# Patient Record
Sex: Female | Born: 1995 | Race: White | Hispanic: No | Marital: Single | Smoking: Never smoker
Health system: Southern US, Community
[De-identification: ages and names within clinical notes are randomized; demographics above are authoritative.]

## PROBLEM LIST (undated history)

## (undated) DIAGNOSIS — E039 Hypothyroidism, unspecified: Secondary | ICD-10-CM

## (undated) HISTORY — PX: HEMORROIDECTOMY: SUR656

## (undated) HISTORY — DX: Hypothyroidism, unspecified: E03.9

---

## 2016-01-03 LAB — TSH: TSH: 0 u[IU]/mL — AB (ref <=5.90)

## 2016-03-05 LAB — TSH: TSH: 6.08 u[IU]/mL — AB (ref <=5.90)

## 2016-04-09 ENCOUNTER — Ambulatory Visit (INDEPENDENT_AMBULATORY_CARE_PROVIDER_SITE_OTHER): Payer: BLUE CROSS/BLUE SHIELD | Admitting: "Endocrinology

## 2016-04-09 ENCOUNTER — Encounter: Payer: Self-pay | Admitting: "Endocrinology

## 2016-04-09 VITALS — BP 107/71 | HR 81 | Ht 64.0 in | Wt 191.0 lb

## 2016-04-09 DIAGNOSIS — E039 Hypothyroidism, unspecified: Secondary | ICD-10-CM | POA: Diagnosis not present

## 2016-04-09 NOTE — Progress Notes (Signed)
Subjective:    Patient ID: Emily Deleon, female    DOB: [birth year], PCP Valla Leaver, MD   Past Medical History:  Diagnosis Date  . Hypothyroidism    Past Surgical History:  Procedure Laterality Date  . HEMORROIDECTOMY     Social History   Social History  . Marital status: Single    Spouse name: N/A  . Number of children: N/A  . Years of education: N/A   Social History Main Topics  . Smoking status: Never Smoker  . Smokeless tobacco: Never Used  . Alcohol use No  . Drug use: No  . Sexual activity: Not Asked   Other Topics Concern  . None   Social History Narrative  . None   Outpatient Encounter Prescriptions as of 04/09/2016  Medication Sig  . levothyroxine (SYNTHROID, LEVOTHROID) 25 MCG tablet Take 12.5 mcg by mouth daily.   No facility-administered encounter medications on file as of 04/09/2016.    ALLERGIES: Allergies  Allergen Reactions  . Penicillins     VACCINATION STATUS:  There is no immunization history on file for this patient.  HPI  21 year old female patient with medical history as above. She is being seen in consultation for hypothyroidism requested by her PCP  Valla Leaver, MD. - She has a long-standing history of thyroid dysfunction. She reports that at age 38 she was found to have hyperthyroidism however not offered any kind of treatment. She did have fluctuating thyroid function test over the years until last August 2017 when she was started on levothyroxine 25 mg by mouth every morning. - On January 03, 2016 her TSH was undetectable along with free T4 of 2.94 which prompted the discontinuation of her levothyroxine. -On subsequent visits with her PMD on 03/05/2016 she was found to have TSH of 6.08, free T4 0.73, and free T3 3.0. She was restarted on levothyroxine 12.5 g and she has taken it ever since. -She reports compliance to this medication. - She denies any exposure to radioactive iodine therapy, thyroid  surgery, exposure to any over-the-counter thyroid supplements. -She has family history of hypothyroidism in her mother who is taking levothyroxine. -He does not have to clearly acute symptoms today, however she reports progressive weight gain. -She denies cold, heat intolerance. She denies dysphagia, shortness of breath, nor voice change.  Review of Systems  Constitutional: Progressive weight gain, no fatigue, no subjective hyperthermia, no subjective hypothermia Eyes: no blurry vision, no xerophthalmia ENT: no sore throat, no nodules palpated in throat, no dysphagia/odynophagia, no hoarseness Cardiovascular: no Chest Pain, no Shortness of Breath, no palpitations, no leg swelling Respiratory: no cough, no SOB Gastrointestinal: no Nausea/Vomiting/Diarhhea Musculoskeletal: no muscle/joint aches Skin: no rashes Neurological: no tremors, no numbness, no tingling, no dizziness Psychiatric: no depression, no anxiety  Objective:    BP 107/71   Pulse 81   Ht 5\' 4"  (1.626 m)   Wt 191 lb (86.6 kg)   BMI 32.79 kg/m   Wt Readings from Last 3 Encounters:  04/09/16 191 lb (86.6 kg)    Physical Exam  Constitutional: Significantly over weight for hight, not in acute distress, normal state of mind Eyes: PERRLA, EOMI, no exophthalmos ENT: moist mucous membranes, no thyromegaly, no cervical lymphadenopathy Cardiovascular: normal precordial activity, Regular Rate and Rhythm, no Murmur/Rubs/Gallops Respiratory:  adequate breathing efforts, no gross chest deformity, Clear to auscultation bilaterally Gastrointestinal: abdomen soft, Non -tender, No distension, Bowel Sounds present Musculoskeletal: no gross deformities, strength intact in all four extremities Skin: moist, warm, no rashes Neurological:  no tremor with outstretched hands, Deep tendon reflexes normal in all four extremities.  Recent Results (from the past 2160 hour(s))  TSH     Status: Abnormal   Collection Time: 03/05/16 12:00 AM   Result Value Ref Range   TSH 6.08 (A) 0.41 - 5.90 uIU/mL    Comment: Free T4 0.73, free T3 3.0       Assessment & Plan:   1. Hypothyroidism, unspecified type - This patient is being seen in kind request of  EGGLESTON-CLARK,VALENICA, MD. - I have reviewed her available records and clinically evaluated patient. She does not seem to be specifically symptomatic at this time. I kept her on the same small dose of levothyroxine 12.5 g by mouth every morning.    - We discussed about correct intake of levothyroxine, at fasting, with water, separated by at least 30 minutes from breakfast, and separated by more than 4 hours from calcium, iron, multivitamins, acid reflux medications (PPIs). -Patient is made aware of the fact that thyroid hormone replacement is needed for life, dose to be adjusted by periodic monitoring of thyroid function tests.   However I'm a I will send her to lab to obtain full profile thyroid function test including TSH, free T4, TPO antibodies, and thyroglobulin antibodies. -Based on these results, her levothyroxine dose would be adjusted next visit in 1 week.  - Based on her current body weight, she would require higher dose of levothyroxine, if her next labs are in agreement for hypothyroidism. -I have advised her to stay away from over-the-counter thyroid supplements. -She does not have clinical goiter hence no need for immediate imaging of the thyroid.  - I advised patient to maintain close follow up with Valla LeaverEGGLESTON-CLARK,VALENICA, MD for primary care needs. Follow up plan: Return in about 1 week (around 04/16/2016) for labs today.  Marquis LunchGebre Cordelia Bessinger, MD Phone: [phone]  Fax: [phone]   04/09/2016, 11:22 AM

## 2016-04-10 LAB — THYROGLOBULIN ANTIBODY: THYROGLOBULIN AB: 188 [IU]/mL — AB (ref <2)

## 2016-04-10 LAB — T4, FREE: Free T4: 1.1 ng/dL (ref 0.8–1.8)

## 2016-04-10 LAB — THYROID PEROXIDASE ANTIBODY: Thyroperoxidase Ab SerPl-aCnc: 2 IU/mL (ref <9)

## 2016-04-10 LAB — TSH: TSH: 4.29 m[IU]/L

## 2016-04-16 ENCOUNTER — Ambulatory Visit: Payer: BLUE CROSS/BLUE SHIELD | Admitting: "Endocrinology

## 2016-04-24 ENCOUNTER — Encounter: Payer: Self-pay | Admitting: "Endocrinology

## 2016-04-24 ENCOUNTER — Ambulatory Visit (INDEPENDENT_AMBULATORY_CARE_PROVIDER_SITE_OTHER): Payer: BLUE CROSS/BLUE SHIELD | Admitting: "Endocrinology

## 2016-04-24 VITALS — BP 106/73 | HR 74 | Ht 64.0 in | Wt 195.0 lb

## 2016-04-24 DIAGNOSIS — E063 Autoimmune thyroiditis: Secondary | ICD-10-CM

## 2016-04-24 DIAGNOSIS — E038 Other specified hypothyroidism: Secondary | ICD-10-CM | POA: Diagnosis not present

## 2016-04-24 MED ORDER — LEVOTHYROXINE SODIUM 25 MCG PO TABS: 25.0000 ug | ORAL_TABLET | Freq: Every day | ORAL | 2 refills | Status: DC

## 2016-04-24 NOTE — Progress Notes (Signed)
Subjective:    Patient ID: Emily Deleon, female    DOB: September 05, 1995, PCP Valla Leaver, MD   Past Medical History:  Diagnosis Date  . Hypothyroidism    Past Surgical History:  Procedure Laterality Date  . HEMORROIDECTOMY     Social History   Social History  . Marital status: Single    Spouse name: N/A  . Number of children: N/A  . Years of education: N/A   Social History Main Topics  . Smoking status: Never Smoker  . Smokeless tobacco: Never Used  . Alcohol use No  . Drug use: No  . Sexual activity: Not Asked   Other Topics Concern  . None   Social History Narrative  . None   Outpatient Encounter Prescriptions as of 04/24/2016  Medication Sig  . levothyroxine (SYNTHROID, LEVOTHROID) 25 MCG tablet Take 1 tablet (25 mcg total) by mouth daily before breakfast.  . [DISCONTINUED] levothyroxine (SYNTHROID, LEVOTHROID) 25 MCG tablet Take 12.5 mcg by mouth daily.   No facility-administered encounter medications on file as of 04/24/2016.    ALLERGIES: Allergies  Allergen Reactions  . Penicillins     VACCINATION STATUS:  There is no immunization history on file for this patient.  HPI  21 year old female patient with medical history as above. She is being seen in f/u for hypothyroidism.  - She remains on levothyroxine 12.5 g daily.-She reports compliance to this medication. - She has no new complaints, however, she has progressive weight gain.  - She denies any exposure to radioactive iodine therapy, thyroid surgery, exposure to any over-the-counter thyroid supplements. -She has family history of hypothyroidism in her mother who is taking levothyroxine. -She denies cold, heat intolerance. She denies dysphagia, shortness of breath, nor voice change.  Review of Systems  Constitutional: Progressive weight gain, no fatigue, no subjective hyperthermia, no subjective hypothermia Eyes: no blurry vision, no xerophthalmia ENT: no sore throat, no nodules  palpated in throat, no dysphagia/odynophagia, no hoarseness Cardiovascular: no Chest Pain, no Shortness of Breath, no palpitations, no leg swelling Respiratory: no cough, no SOB Gastrointestinal: no Nausea/Vomiting/Diarhhea Musculoskeletal: no muscle/joint aches Skin: no rashes Neurological: no tremors, no numbness, no tingling, no dizziness Psychiatric: no depression, no anxiety  Objective:    BP 106/73   Pulse 74   Ht  (1.626 m)   Wt 195 lb (88.5 kg)   BMI 33.47 kg/m   Wt Readings from Last 3 Encounters:  04/24/16 195 lb (88.5 kg)  04/09/16 191 lb (86.6 kg)    Physical Exam  Constitutional: Significantly over weight for hight, not in acute distress, normal state of mind Eyes: PERRLA, EOMI, no exophthalmos ENT: moist mucous membranes, no thyromegaly, no cervical lymphadenopathy Cardiovascular: normal precordial activity, Regular Rate and Rhythm, no Murmur/Rubs/Gallops Respiratory:  adequate breathing efforts, no gross chest deformity, Clear to auscultation bilaterally Gastrointestinal: abdomen soft, Non -tender, No distension, Bowel Sounds present Musculoskeletal: no gross deformities, strength intact in all four extremities Skin: moist, warm, no rashes Neurological: no tremor with outstretched hands, Deep tendon reflexes normal in all four extremities.  Recent Results (from the past 2160 hour(s))  TSH     Status: Abnormal   Collection Time: 03/05/16 12:00 AM  Result Value Ref Range   TSH 6.08 (A) 0.41 - 5.90 uIU/mL    Comment: Free T4 0.73, free T3 3.0  Thyroglobulin antibody     Status: Abnormal   Collection Time: 04/09/16 10:48 AM  Result Value Ref Range   Thyroglobulin Ab 188 (H) <2  IU/mL  Thyroid peroxidase antibody     Status: None   Collection Time: 04/09/16 10:48 AM  Result Value Ref Range   Thyroperoxidase Ab SerPl-aCnc 2 <9 IU/mL  T4, free     Status: None   Collection Time: 04/09/16 10:48 AM  Result Value Ref Range   Free T4 1.1 0.8 - 1.8 ng/dL  TSH      Status: None   Collection Time: 04/09/16 10:48 AM  Result Value Ref Range   TSH 4.29 mIU/L    Comment:   Reference Range   > or = 20 Years  0.40-4.50   Pregnancy Range First trimester  0.26-2.66 Second trimester 0.55-2.73 Third trimester  0.43-2.91          Assessment & Plan:   1. Hypothyroidism, 2. Hashimoto's thyroiditis  She has hypothyroidism due to Hashimoto's thyroiditis. She will benefit from increase in her levothyroxine. I advised her to increase her levothyroxine to 25 g by mouth every morning. She may require higher dose on subsequent visits depending on her labs.  - We discussed about correct intake of levothyroxine, at fasting, with water, separated by at least 30 minutes from breakfast, and separated by more than 4 hours from calcium, iron, multivitamins, acid reflux medications (PPIs). -Patient is made aware of the fact that thyroid hormone replacement is needed for life, dose to be adjusted by periodic monitoring of thyroid function tests.  - She is advised to contact us if she plans or confirms pregnancy since she will need higher dose of levothyroxine during early pregnancy. -She does not have clinical goiter hence no need for immediate imaging of the thyroid.  - I advised patient to maintain close follow up with Valla Leaver, MD for primary care needs. Follow up plan: Return in about 3 months (around 07/24/2016) for follow up with pre-visit labs.  Marquis Lunch, MD Phone: [phone]  Fax: [phone]   04/24/2016, 9:33 AM

## 2016-08-09 ENCOUNTER — Other Ambulatory Visit: Payer: Self-pay | Admitting: "Endocrinology

## 2016-08-09 ENCOUNTER — Ambulatory Visit: Payer: BLUE CROSS/BLUE SHIELD | Admitting: "Endocrinology

## 2016-08-09 LAB — TSH: TSH: 3.73 mIU/L

## 2016-08-09 LAB — T4, FREE: Free T4: 1.3 ng/dL (ref 0.8–1.8)

## 2016-08-20 ENCOUNTER — Encounter: Payer: Self-pay | Admitting: "Endocrinology

## 2016-08-20 ENCOUNTER — Ambulatory Visit (INDEPENDENT_AMBULATORY_CARE_PROVIDER_SITE_OTHER): Payer: BLUE CROSS/BLUE SHIELD | Admitting: "Endocrinology

## 2016-08-20 VITALS — BP 123/82 | HR 65 | Ht 64.0 in | Wt 198.0 lb

## 2016-08-20 DIAGNOSIS — E038 Other specified hypothyroidism: Secondary | ICD-10-CM

## 2016-08-20 DIAGNOSIS — E063 Autoimmune thyroiditis: Secondary | ICD-10-CM

## 2016-08-20 MED ORDER — LEVOTHYROXINE SODIUM 50 MCG PO TABS: 50.0000 ug | ORAL_TABLET | Freq: Every day | ORAL | 6 refills | Status: DC

## 2016-08-20 NOTE — Progress Notes (Signed)
Subjective:    Patient ID: Emily Deleon, female    DOB: 1995/05/22, PCP Valla LeaverEggleston-Clark, Valenica, MD   Past Medical History:  Diagnosis Date  . Hypothyroidism    Past Surgical History:  Procedure Laterality Date  . HEMORROIDECTOMY     Social History   Social History  . Marital status: Single    Spouse name: N/A  . Number of children: N/A  . Years of education: N/A   Social History Main Topics  . Smoking status: Never Smoker  . Smokeless tobacco: Never Used  . Alcohol use No  . Drug use: No  . Sexual activity: Not Asked   Other Topics Concern  . None   Social History Narrative  . None   Outpatient Encounter Prescriptions as of 08/20/2016  Medication Sig  . levothyroxine (SYNTHROID, LEVOTHROID) 50 MCG tablet Take 1 tablet (50 mcg total) by mouth daily before breakfast.  . [DISCONTINUED] levothyroxine (SYNTHROID, LEVOTHROID) 25 MCG tablet Take 1 tablet (25 mcg total) by mouth daily before breakfast.   No facility-administered encounter medications on file as of 08/20/2016.    ALLERGIES: Allergies  Allergen Reactions  . Penicillins     VACCINATION STATUS:  There is no immunization history on file for this patient.  HPI  21 year old female patient with medical history as above. She is being seen in f/u for hypothyroidism.  - She remains on levothyroxine 25 g daily.-She reports compliance to this medication. - She has no new complaints, however, she has progressive weight gain.  - She denies any exposure to radioactive iodine therapy, thyroid surgery, exposure to any over-the-counter thyroid supplements. -She has family history of hypothyroidism in her mother who is taking levothyroxine. -She denies cold, heat intolerance. She denies dysphagia, shortness of breath, nor voice change.  Review of Systems  Constitutional: + Progressive weight gain, no fatigue, no subjective hyperthermia, no subjective hypothermia Eyes: no blurry vision, no  xerophthalmia ENT: no sore throat, no nodules palpated in throat, no dysphagia/odynophagia, no hoarseness Cardiovascular: no Chest Pain, no Shortness of Breath, no palpitations, no leg swelling Respiratory: no cough, no SOB Gastrointestinal: no Nausea/Vomiting/Diarhhea Musculoskeletal: no muscle/joint aches Skin: no rashes Neurological: no tremors, no numbness, no tingling, no dizziness Psychiatric: no depression, no anxiety  Objective:    BP 123/82   Pulse 65   Ht 5\' 4"  (1.626 m)   Wt 198 lb (89.8 kg)   BMI 33.99 kg/m   Wt Readings from Last 3 Encounters:  08/20/16 198 lb (89.8 kg)  04/24/16 195 lb (88.5 kg)  04/09/16 191 lb (86.6 kg)    Physical Exam  Constitutional: Significantly over weight for hight, not in acute distress, normal state of mind Eyes: PERRLA, EOMI, no exophthalmos ENT: moist mucous membranes, no thyromegaly, no cervical lymphadenopathy Cardiovascular: normal precordial activity, Regular Rate and Rhythm, no Murmur/Rubs/Gallops Respiratory:  adequate breathing efforts, no gross chest deformity, Clear to auscultation bilaterally Gastrointestinal: abdomen soft, Non -tender, No distension, Bowel Sounds present Musculoskeletal: no gross deformities, strength intact in all four extremities Skin: moist, warm, no rashes Neurological: no tremor with outstretched hands, Deep tendon reflexes normal in all four extremities.  Recent Results (from the past 2160 hour(s))  TSH     Status: None   Collection Time: 08/09/16  9:03 AM  Result Value Ref Range   TSH 3.73 mIU/L    Comment:   Reference Range   > or = 20 Years  0.40-4.50   Pregnancy Range First trimester  0.26-2.66 Second trimester 0.55-2.73  Third trimester  0.43-2.91     T4, free     Status: None   Collection Time: 08/09/16  9:03 AM  Result Value Ref Range   Free T4 1.3 0.8 - 1.8 ng/dL       Assessment & Plan:   1. Hypothyroidism, 2. Hashimoto's thyroiditis  She has hypothyroidism due to  Hashimoto's thyroiditis. She will benefit from increase in her levothyroxine. I advised her to increase her levothyroxine to 50 g by mouth every morning. She may require higher dose on subsequent visits depending on her labs.  - We discussed about correct intake of levothyroxine, at fasting, with water, separated by at least 30 minutes from breakfast, and separated by more than 4 hours from calcium, iron, multivitamins, acid reflux medications (PPIs). -Patient is made aware of the fact that thyroid hormone replacement is needed for life, dose to be adjusted by periodic monitoring of thyroid function tests.  - She is advised to contact us if she plans or confirms pregnancy since she will need higher dose of levothyroxine during early pregnancy. -She does not have clinical goiter hence no need for immediate imaging of the thyroid.  - I advised patient to maintain close follow up with Valla LeaverEggleston-Clark, Valenica, MD for primary care needs. Follow up plan: Return in about 3 months (around 11/20/2016) for follow up with pre-visit labs.  Marquis LunchGebre Wavie Hashimi, MD Phone: [phone]  Fax: [phone]   08/20/2016, 9:49 AM

## 2016-11-26 ENCOUNTER — Ambulatory Visit: Payer: BLUE CROSS/BLUE SHIELD | Admitting: "Endocrinology

## 2016-12-17 LAB — T4, FREE: FREE T4: 1.1 ng/dL (ref 0.8–1.8)

## 2016-12-17 LAB — TSH: TSH: 1.56 mIU/L

## 2016-12-18 ENCOUNTER — Encounter: Payer: Self-pay | Admitting: "Endocrinology

## 2016-12-18 ENCOUNTER — Ambulatory Visit (INDEPENDENT_AMBULATORY_CARE_PROVIDER_SITE_OTHER): Payer: BLUE CROSS/BLUE SHIELD | Admitting: "Endocrinology

## 2016-12-18 VITALS — BP 127/77 | HR 69 | Ht 69.0 in | Wt 213.0 lb

## 2016-12-18 DIAGNOSIS — E038 Other specified hypothyroidism: Secondary | ICD-10-CM | POA: Diagnosis not present

## 2016-12-18 DIAGNOSIS — E063 Autoimmune thyroiditis: Secondary | ICD-10-CM | POA: Diagnosis not present

## 2016-12-18 MED ORDER — LEVOTHYROXINE SODIUM 75 MCG PO TABS: 75.0000 ug | ORAL_TABLET | Freq: Every day | ORAL | 6 refills | Status: DC

## 2016-12-18 NOTE — Progress Notes (Signed)
Subjective:    Patient ID: Emily Deleon, female    DOB: 1995-10-10, PCP Valla LeaverEggleston-Clark, Valenica, MD   Past Medical History:  Diagnosis Date  . Hypothyroidism    Past Surgical History:  Procedure Laterality Date  . HEMORROIDECTOMY     Social History   Socioeconomic History  . Marital status: Single    Spouse name: None  . Number of children: None  . Years of education: None  . Highest education level: None  Social Needs  . Financial resource strain: None  . Food insecurity - worry: None  . Food insecurity - inability: None  . Transportation needs - medical: None  . Transportation needs - non-medical: None  Occupational History  . None  Tobacco Use  . Smoking status: Never Smoker  . Smokeless tobacco: Never Used  Substance and Sexual Activity  . Alcohol use: No  . Drug use: No  . Sexual activity: None  Other Topics Concern  . None  Social History Narrative  . None   Outpatient Encounter Medications as of 12/18/2016  Medication Sig  . levothyroxine (SYNTHROID, LEVOTHROID) 75 MCG tablet Take 1 tablet (75 mcg total) by mouth daily before breakfast.  . [DISCONTINUED] levothyroxine (SYNTHROID, LEVOTHROID) 50 MCG tablet Take 1 tablet (50 mcg total) by mouth daily before breakfast.   No facility-administered encounter medications on file as of 12/18/2016.    ALLERGIES: Allergies  Allergen Reactions  . Penicillins     VACCINATION STATUS:  There is no immunization history on file for this patient.  HPI  21 year old female patient with medical history as above. She is being seen in f/u for hypothyroidism.  - She remains on levothyroxine 50 g daily.-She reports compliance to this medication. - She has no new complaints, however, she has progressive weight gain.  - She denies any exposure to radioactive iodine therapy, thyroid surgery, exposure to any over-the-counter thyroid supplements. -She has family history of hypothyroidism in her mother who is taking  levothyroxine. -She denies cold, heat intolerance. She denies dysphagia, shortness of breath, nor voice change.  Review of Systems  Constitutional: + Progressive weight gain, no fatigue, no subjective hyperthermia, no subjective hypothermia Eyes: no blurry vision, no xerophthalmia ENT: no sore throat, no nodules palpated in throat, no dysphagia/odynophagia, no hoarseness Cardiovascular: no Chest Pain, no Shortness of Breath, no palpitations, no leg swelling Respiratory: no cough, no SOB Gastrointestinal: no Nausea/Vomiting/Diarhhea Musculoskeletal: no muscle/joint aches Skin: no rashes Neurological: no tremors, no numbness, no tingling, no dizziness Psychiatric: no depression, no anxiety  Objective:    BP 127/77   Pulse 69   Ht 5\' 9"  (1.753 m)   Wt 213 lb (96.6 kg)   BMI 31.45 kg/m   Wt Readings from Last 3 Encounters:  12/18/16 213 lb (96.6 kg)  08/20/16 198 lb (89.8 kg)  04/24/16 195 lb (88.5 kg)    Physical Exam  Constitutional: Significantly over weight for height, not in acute distress, normal state of mind Eyes: PERRLA, EOMI, no exophthalmos ENT: moist mucous membranes, no thyromegaly, no cervical lymphadenopathy Cardiovascular: normal precordial activity, Regular Rate and Rhythm, no Murmur/Rubs/Gallops Respiratory:  adequate breathing efforts, no gross chest deformity, Clear to auscultation bilaterally Gastrointestinal: abdomen soft, Non -tender, No distension, Bowel Sounds present Musculoskeletal: no gross deformities, strength intact in all four extremities Skin: moist, warm, no rashes Neurological: no tremor with outstretched hands, Deep tendon reflexes normal in all four extremities.  Recent Results (from the past 2160 hour(s))  T4, free  Status: None   Collection Time: 12/16/16  9:38 AM  Result Value Ref Range   Free T4 1.1 0.8 - 1.8 ng/dL  TSH     Status: None   Collection Time: 12/16/16  9:38 AM  Result Value Ref Range   TSH 1.56 mIU/L    Comment:            Reference Range .           > or = 20 Years  0.40-4.50 .                Pregnancy Ranges           First trimester    0.26-2.66           Second trimester   0.55-2.73           Third trimester    0.43-2.91        Assessment & Plan:   1. Hypothyroidism, 2. Hashimoto's thyroiditis  She has hypothyroidism due to Hashimoto's thyroiditis. She will benefit from increase in her levothyroxine. I advised her to increase her levothyroxine to 75 g by mouth every morning. She may require higher dose on subsequent visits depending on her labs.  - We discussed about correct intake of levothyroxine, at fasting, with water, separated by at least 30 minutes from breakfast, and separated by more than 4 hours from calcium, iron, multivitamins, acid reflux medications (PPIs). -Patient is made aware of the fact that thyroid hormone replacement is needed for life, dose to be adjusted by periodic monitoring of thyroid function tests.  - She is advised to contact us if she plans or confirms pregnancy since she will need higher dose of levothyroxine during early pregnancy. -She does not have clinical goiter hence no need for immediate imaging of the thyroid. - Given her concern of progressive weight gain, although unlikely, I will include 24-hour urine free cortisol along with her next lab work.  - I advised patient to maintain close follow up with Valla LeaverEggleston-Clark, Valenica, MD for primary care needs. Follow up plan: Return in about 6 months (around 06/17/2017) for follow up with pre-visit labs, 24 hour urine free Cortisol.  Marquis LunchGebre Ferdinand Revoir, MD Phone: [phone]  Fax: [phone]  -  This note was partially dictated with voice recognition software. Similar sounding words can be transcribed inadequately or may not  be corrected upon review.  12/18/2016, 11:33 AM

## 2017-04-30 ENCOUNTER — Other Ambulatory Visit: Payer: Self-pay | Admitting: "Endocrinology

## 2017-06-12 LAB — TSH: TSH: 1.22 m[IU]/L

## 2017-06-12 LAB — T4, FREE: FREE T4: 1.2 ng/dL (ref 0.8–1.8)

## 2017-06-19 ENCOUNTER — Encounter: Payer: Self-pay | Admitting: "Endocrinology

## 2017-06-19 ENCOUNTER — Ambulatory Visit (INDEPENDENT_AMBULATORY_CARE_PROVIDER_SITE_OTHER): Payer: BLUE CROSS/BLUE SHIELD | Admitting: "Endocrinology

## 2017-06-19 VITALS — BP 111/75 | HR 76 | Ht 69.0 in | Wt 207.0 lb

## 2017-06-19 DIAGNOSIS — E038 Other specified hypothyroidism: Secondary | ICD-10-CM

## 2017-06-19 DIAGNOSIS — E063 Autoimmune thyroiditis: Secondary | ICD-10-CM | POA: Diagnosis not present

## 2017-06-19 DIAGNOSIS — E049 Nontoxic goiter, unspecified: Secondary | ICD-10-CM

## 2017-06-19 MED ORDER — LEVOTHYROXINE SODIUM 75 MCG PO TABS: ORAL_TABLET | ORAL | 6 refills | Status: DC

## 2017-06-19 NOTE — Progress Notes (Signed)
Subjective:    Patient ID: Emily Deleon, female    DOB: November 19, 1995, PCP Valla Leaver, MD   Past Medical History:  Diagnosis Date  . Hypothyroidism    Past Surgical History:  Procedure Laterality Date  . HEMORROIDECTOMY     Social History   Socioeconomic History  . Marital status: Single    Spouse name: Not on file  . Number of children: Not on file  . Years of education: Not on file  . Highest education level: Not on file  Occupational History  . Not on file  Social Needs  . Financial resource strain: Not on file  . Food insecurity:    Worry: Not on file    Inability: Not on file  . Transportation needs:    Medical: Not on file    Non-medical: Not on file  Tobacco Use  . Smoking status: Never Smoker  . Smokeless tobacco: Never Used  Substance and Sexual Activity  . Alcohol use: No  . Drug use: No  . Sexual activity: Not on file  Lifestyle  . Physical activity:    Days per week: Not on file    Minutes per session: Not on file  . Stress: Not on file  Relationships  . Social connections:    Talks on phone: Not on file    Gets together: Not on file    Attends religious service: Not on file    Active member of club or organization: Not on file    Attends meetings of clubs or organizations: Not on file    Relationship status: Not on file  Other Topics Concern  . Not on file  Social History Narrative  . Not on file   Outpatient Encounter Medications as of 06/19/2017  Medication Sig  . levothyroxine (SYNTHROID, LEVOTHROID) 75 MCG tablet TAKE 1 TABLET(75 MCG) BY MOUTH DAILY BEFORE BREAKFAST  . [DISCONTINUED] levothyroxine (SYNTHROID, LEVOTHROID) 75 MCG tablet TAKE 1 TABLET(75 MCG) BY MOUTH DAILY BEFORE BREAKFAST   No facility-administered encounter medications on file as of 06/19/2017.    ALLERGIES: Allergies  Allergen Reactions  . Penicillins     VACCINATION STATUS:  There is no immunization history on file for this  patient.  HPI  22 year old female patient with medical history as above. She is being seen in f/u for hypothyroidism due to Hashimoto's thyroiditis.  -She is currently on levothyroxine 75 mcg p.o. nightly.  She reports compliance to this medication.   -She has no new complaints.  She has lost approximately 6 pounds since last visit.    - She denies any exposure to radioactive iodine therapy, thyroid surgery, exposure to any over-the-counter thyroid supplements. -She has family history of hypothyroidism in her mother who is taking levothyroxine. -She denies cold, heat intolerance. She denies dysphagia, shortness of breath, nor voice change.  Review of Systems  Constitutional: + Lost 6 pounds intentionally,  no fatigue, no subjective hyperthermia, no subjective hypothermia Eyes: no blurry vision, no xerophthalmia ENT: no sore throat, no nodules palpated in throat, no dysphagia/odynophagia, no hoarseness Cardiovascular: no Chest Pain, no Shortness of Breath, no palpitations, no leg swelling Respiratory: no cough, no SOB Gastrointestinal: no Nausea/Vomiting/Diarhhea Musculoskeletal: no muscle/joint aches Skin: no rashes Neurological: no tremors, no dizziness.  Psychiatric: no depression, no anxiety  Objective:    BP 111/75   Pulse 76   Ht  (1.753 m)   Wt 207 lb (93.9 kg)   BMI 30.57 kg/m   Wt Readings from Last 3  Encounters:  06/19/17 207 lb (93.9 kg)  12/18/16 213 lb (96.6 kg)  08/20/16 198 lb (89.8 kg)    Physical Exam  Constitutional: Significantly over weight for height, not in acute distress, normal state of mind.   Eyes: PERRLA, EOMI, no exophthalmos ENT: moist mucous membranes, + palpable thyroid, no cervical lymphadenopathy Cardiovascular: normal precordial activity, Regular Rate and Rhythm, no Murmur/Rubs/Gallops Respiratory:  adequate breathing efforts, no gross chest deformity, Clear to auscultation bilaterally Gastrointestinal: abdomen soft, Non -tender, No  distension, Bowel Sounds present Musculoskeletal: no gross deformities, strength intact in all four extremities Skin: moist, warm, no rashes Neurological: no tremor with outstretched hands   Recent Results (from the past 2160 hour(s))  TSH     Status: None   Collection Time: 06/12/17  9:40 AM  Result Value Ref Range   TSH 1.22 mIU/L    Comment:           Reference Range .           > or = 20 Years  0.40-4.50 .                Pregnancy Ranges           First trimester    0.26-2.66           Second trimester   0.55-2.73           Third trimester    0.43-2.91   T4, free     Status: None   Collection Time: 06/12/17  9:40 AM  Result Value Ref Range   Free T4 1.2 0.8 - 1.8 ng/dL       Assessment & Plan:   1. Hypothyroidism, 2. Hashimoto's thyroiditis  She has hypothyroidism due to Hashimoto's thyroiditis.  -Her previsit thyroid function tests are consistent with appropriate replacement at this time.   -I advised her to continue levothyroxine 75 mcg p.o. every morning.  she may require higher dose on subsequent visits depending on her labs.   - We discussed about correct intake of levothyroxine, at fasting, with water, separated by at least 30 minutes from breakfast, and separated by more than 4 hours from calcium, iron, multivitamins, acid reflux medications (PPIs). -Patient is made aware of the fact that thyroid hormone replacement is needed for life, dose to be adjusted by periodic monitoring of thyroid function tests.  - She is advised to contact us if she plans or confirms pregnancy since she will need higher dose of levothyroxine during early pregnancy.  -She has palpable thyroid, will obtain baseline thyroid ultrasound.    - I advised patient to maintain close follow up with Valla Leaver, MD for primary care needs.  Follow up plan: Return in about 6 months (around 12/20/2017) for follow up with pre-visit labs, Thyroid / Neck Ultrasound.  Marquis Lunch,  MD Phone: [phone]  Fax: [phone]  -  This note was partially dictated with voice recognition software. Similar sounding words can be transcribed inadequately or may not  be corrected upon review.  06/19/2017, 10:01 AM

## 2017-11-19 ENCOUNTER — Other Ambulatory Visit: Payer: Self-pay | Admitting: "Endocrinology

## 2017-12-04 ENCOUNTER — Telehealth: Payer: Self-pay | Admitting: "Endocrinology

## 2017-12-04 NOTE — Telephone Encounter (Signed)
Emily Deleon appointment with Dr. Fransico HimNida is 12/22/17 and she has not heard anything yet regarding [place] the hospital, please advise?

## 2017-12-08 NOTE — Telephone Encounter (Signed)
Order refaxed to [facility] for [facility].

## 2017-12-16 ENCOUNTER — Telehealth: Payer: Self-pay | Admitting: "Endocrinology

## 2017-12-16 NOTE — Telephone Encounter (Signed)
Emily Deleon is asking for a refill on her levothyroxine (SYNTHROID, LEVOTHROID) 75 MCG tablet please advise?

## 2017-12-17 MED ORDER — LEVOTHYROXINE SODIUM 75 MCG PO TABS: ORAL_TABLET | ORAL | 6 refills | Status: DC

## 2017-12-22 ENCOUNTER — Ambulatory Visit: Payer: BLUE CROSS/BLUE SHIELD | Admitting: "Endocrinology

## 2018-01-29 ENCOUNTER — Ambulatory Visit: Payer: BLUE CROSS/BLUE SHIELD | Admitting: "Endocrinology

## 2018-02-04 LAB — BASIC METABOLIC PANEL
BUN: 9 (ref 4–21)
Creatinine: 0.6 (ref 0.5–1.1)

## 2018-02-04 LAB — TSH: TSH: 2.98 (ref 0.41–5.90)

## 2018-02-04 LAB — LIPID PANEL
Cholesterol: 191 (ref 0–200)
HDL: 32 — AB (ref 35–70)
LDL Cholesterol: 95
Triglycerides: 95 (ref 40–160)

## 2018-02-18 ENCOUNTER — Encounter: Payer: Self-pay | Admitting: "Endocrinology

## 2018-02-18 ENCOUNTER — Ambulatory Visit (INDEPENDENT_AMBULATORY_CARE_PROVIDER_SITE_OTHER): Payer: BLUE CROSS/BLUE SHIELD | Admitting: "Endocrinology

## 2018-02-18 VITALS — BP 109/75 | HR 67 | Ht 69.0 in | Wt 209.0 lb

## 2018-02-18 DIAGNOSIS — E063 Autoimmune thyroiditis: Secondary | ICD-10-CM | POA: Diagnosis not present

## 2018-02-18 DIAGNOSIS — E049 Nontoxic goiter, unspecified: Secondary | ICD-10-CM

## 2018-02-18 DIAGNOSIS — E038 Other specified hypothyroidism: Secondary | ICD-10-CM | POA: Diagnosis not present

## 2018-02-18 MED ORDER — LEVOTHYROXINE SODIUM 88 MCG PO TABS: 88.0000 ug | ORAL_TABLET | Freq: Every day | ORAL | 1 refills | Status: DC

## 2018-02-18 NOTE — Progress Notes (Signed)
Endocrinology follow-up note   Subjective:    Patient ID: Emily Deleon, female    DOB: March 18, 1995, PCP Emily Leaver, MD   Past Medical History:  Diagnosis Date  . Hypothyroidism    Past Surgical History:  Procedure Laterality Date  . HEMORROIDECTOMY     Social History   Socioeconomic History  . Marital status: Single    Spouse name: Not on file  . Number of children: Not on file  . Years of education: Not on file  . Highest education level: Not on file  Occupational History  . Not on file  Social Needs  . Financial resource strain: Not on file  . Food insecurity:    Worry: Not on file    Inability: Not on file  . Transportation needs:    Medical: Not on file    Non-medical: Not on file  Tobacco Use  . Smoking status: Never Smoker  . Smokeless tobacco: Never Used  Substance and Sexual Activity  . Alcohol use: No  . Drug use: No  . Sexual activity: Not on file  Lifestyle  . Physical activity:    Days per week: Not on file    Minutes per session: Not on file  . Stress: Not on file  Relationships  . Social connections:    Talks on phone: Not on file    Gets together: Not on file    Attends religious service: Not on file    Active member of club or organization: Not on file    Attends meetings of clubs or organizations: Not on file    Relationship status: Not on file  Other Topics Concern  . Not on file  Social History Narrative  . Not on file   Outpatient Encounter Medications as of 02/18/2018  Medication Sig  . levothyroxine (SYNTHROID, LEVOTHROID) 88 MCG tablet Take 1 tablet (88 mcg total) by mouth daily before breakfast.  . [DISCONTINUED] levothyroxine (SYNTHROID, LEVOTHROID) 75 MCG tablet TAKE 1 TABLET(75 MCG) BY MOUTH DAILY BEFORE BREAKFAST  . [DISCONTINUED] levothyroxine (SYNTHROID, LEVOTHROID) 75 MCG tablet TAKE 1 TABLET(75 MCG) BY MOUTH DAILY BEFORE BREAKFAST   No facility-administered encounter medications on file as of 02/18/2018.     ALLERGIES: Allergies  Allergen Reactions  . Penicillins     VACCINATION STATUS:  There is no immunization history on file for this patient.  HPI  23 year old female patient with medical history as above. She is being seen in follow-up for hypothyroidism due to Hashimoto's thyroiditis.  -She is currently on levothyroxine 75 mcg p.o. nightly.  She reports compliance to this medication.   -She has no new complaints.  She has a steady weight since last visit.  Previously ordered thyroid ultrasound was not performed before this visit. -She has family history of hypothyroidism in her mother who is taking levothyroxine. -She denies cold, heat intolerance. She denies dysphagia, shortness of breath, nor voice change.  Review of Systems  Constitutional: + steady Weight,  no fatigue, no subjective hyperthermia, no subjective hypothermia Eyes: no blurry vision, no xerophthalmia ENT: no sore throat, no nodules palpated in throat, no dysphagia/odynophagia, no hoarseness Musculoskeletal: no muscle/joint aches Skin: no rashes Neurological: no tremors, no dizziness.  Psychiatric: no depression, no anxiety  Objective:    BP 109/75   Pulse 67   Ht 5\' 9"  (1.753 m)   Wt 209 lb (94.8 kg)   BMI 30.86 kg/m   Wt Readings from Last 3 Encounters:  02/18/18 209 lb (94.8 kg)  06/19/17  207 lb (93.9 kg)  12/18/16 213 lb (96.6 kg)    Physical Exam  Constitutional: Significantly over weight for height, not in acute distress, normal state of mind.   Eyes: PERRLA, EOMI, no exophthalmos ENT: moist mucous membranes, + palpable thyroid, no cervical lymphadenopathy  Musculoskeletal: no gross deformities, strength intact in all four extremities Skin: moist, warm, no rashes Neurological: no tremor with outstretched hands  February 04, 2018 labs showed TSH 2.98.  Recent Results (from the past 2160 hour(s))  Lipid panel     Status: Abnormal   Collection Time: 02/04/18 12:00 AM  Result Value Ref  Range   Triglycerides 95 40 - 160   Cholesterol 191 0 - 200   HDL 32 (A) 35 - 70   LDL Cholesterol 95   TSH     Status: None   Collection Time: 02/04/18 12:00 AM  Result Value Ref Range   TSH 2.98 0.41 - 5.90  Basic metabolic panel     Status: None   Collection Time: 02/04/18 12:00 AM  Result Value Ref Range   BUN 9 4 - 21   Creatinine 0.6 0.5 - 1.1     Assessment & Plan:   1. Hypothyroidism 2. Hashimoto's thyroiditis  She has hypothyroidism due to Hashimoto's thyroiditis.  -Her previsit thyroid function tests are consistent with appropriate replacement at this time.   -However, she would benefit from slight increase in her levothyroxine.  I discussed and increased her levothyroxine to 88 mcg p.o. every morning.     - We discussed about the correct intake of her thyroid hormone, on empty stomach at fasting, with water, separated by at least 30 minutes from breakfast and other medications,  and separated by more than 4 hours from calcium, iron, multivitamins, acid reflux medications (PPIs). -Patient is made aware of the fact that thyroid hormone replacement is needed for life, dose to be adjusted by periodic monitoring of thyroid function tests.  - She is advised to contact us if she plans or confirms pregnancy since she will need higher dose of levothyroxine during early pregnancy.  -She has palpable thyroid, will obtain baseline thyroid ultrasound.    - I advised patient to maintain close follow up with Emily LeaverEggleston-Clark, Valenica, MD for primary care needs.  Follow up plan: Return in about 6 months (around 08/19/2018) for Follow up with Pre-visit Labs, Thyroid / Neck Ultrasound.  Emily LunchGebre Tiajuana Leppanen, MD Phone: [phone]  Fax: [phone]  -  This note was partially dictated with voice recognition software. Similar sounding words can be transcribed inadequately or may not  be corrected upon review.  02/18/2018, 1:04 PM

## 2018-07-08 ENCOUNTER — Other Ambulatory Visit: Payer: Self-pay

## 2018-07-08 ENCOUNTER — Ambulatory Visit (HOSPITAL_COMMUNITY)
Admission: RE | Admit: 2018-07-08 | Discharge: 2018-07-08 | Disposition: A | Discharge status: Home / Self Care | Payer: BC Managed Care – PPO | Source: Ambulatory Visit | Attending: "Endocrinology | Admitting: "Endocrinology

## 2018-07-08 DIAGNOSIS — E063 Autoimmune thyroiditis: Secondary | ICD-10-CM | POA: Diagnosis present

## 2018-07-08 DIAGNOSIS — E038 Other specified hypothyroidism: Secondary | ICD-10-CM | POA: Diagnosis not present

## 2018-08-14 LAB — T4, FREE: Free T4: 1.5 ng/dL (ref 0.8–1.8)

## 2018-08-14 LAB — TSH: TSH: 1.88 m[IU]/L

## 2018-08-19 ENCOUNTER — Ambulatory Visit (INDEPENDENT_AMBULATORY_CARE_PROVIDER_SITE_OTHER): Payer: BC Managed Care – PPO | Admitting: "Endocrinology

## 2018-08-19 ENCOUNTER — Encounter: Payer: Self-pay | Admitting: "Endocrinology

## 2018-08-19 ENCOUNTER — Other Ambulatory Visit: Payer: Self-pay

## 2018-08-19 DIAGNOSIS — E038 Other specified hypothyroidism: Secondary | ICD-10-CM | POA: Diagnosis not present

## 2018-08-19 DIAGNOSIS — E063 Autoimmune thyroiditis: Secondary | ICD-10-CM

## 2018-08-19 MED ORDER — LEVOTHYROXINE SODIUM 88 MCG PO TABS: 88.0000 ug | ORAL_TABLET | Freq: Every day | ORAL | 1 refills | Status: DC

## 2018-08-19 NOTE — Patient Instructions (Signed)
                                       Advice for Weight Management  -For most of [place] the best way to lose weight is by diet management. Generally speaking, diet management means consuming less calories intentionally which over time brings about progressive weight loss.  This can be achieved more effectively by restricting carbohydrate consumption to the minimum possible. So, it is critically important to know your numbers: how much calorie you are consuming and how much calorie you need.   More importantly, our carbohydrates sources should be unprocessed or minimally processed complex starch food items.   Sometimes, it is important to balance nutrition by increasing protein intake (animal or plant source), fruits, and vegetables.  -Sticking to a routine mealtime to eat 3 meals a day and avoiding unnecessary snacks is shown to have a big role in weight control.  -It is better to avoid simple carbohydrates including: Cakes, Sweet Desserts, Ice Cream, Soda (diet and regular), Sweet Tea, Candies, Chips, Cookies, Store Bought Juices, Alcohol in Excess of  1-2 drinks a day, Artificial Sweeteners, Doughnuts, Coffee Creamers, "Sugar-free" Products, etc, etc.  This is not a complete list...Marland Kitchen.    -Consulting with certified diabetes educators is proven to provide you with the most accurate and current information on diet.  Also, you may be  interested in discussing diet options/exchanges , we can schedule a visit with Jearld Fenton, RDN, CDE for individualized nutrition education.  -Exercise: If you are able: 30 -60 minutes a day ,4 days a week, or 150 minutes a week.  The longer the better.  Combine stretch, strength, and aerobic activities.  If you were told in the past that you have high risk for cardiovascular diseases, you may seek evaluation by your heart doctor prior to initiating moderate to intense exercise programs.

## 2018-08-19 NOTE — Progress Notes (Signed)
08/19/2018                                Endocrinology Telehealth Visit Follow up Note -During COVID -19 Pandemic  I connected with Emily Lovenna Wurster on 08/19/2018   by telephone and verified that I am speaking with the correct person using two identifiers. Emily Lovenna Ace, October 24, 1995. she has verbally consented to this visit. All issues noted in this document were discussed and addressed. The format was not optimal for physical exam.  Subjective:    Patient ID: Emily Deleon, female    DOB: October 24, 1995, PCP Valla LeaverEggleston-Clark, Valenica, MD   Past Medical History:  Diagnosis Date  . Hypothyroidism    Past Surgical History:  Procedure Laterality Date  . HEMORROIDECTOMY     Social History   Socioeconomic History  . Marital status: Single    Spouse name: Not on file  . Number of children: Not on file  . Years of education: Not on file  . Highest education level: Not on file  Occupational History  . Not on file  Social Needs  . Financial resource strain: Not on file  . Food insecurity    Worry: Not on file    Inability: Not on file  . Transportation needs    Medical: Not on file    Non-medical: Not on file  Tobacco Use  . Smoking status: Never Smoker  . Smokeless tobacco: Never Used  Substance and Sexual Activity  . Alcohol use: No  . Drug use: No  . Sexual activity: Not on file  Lifestyle  . Physical activity    Days per week: Not on file    Minutes per session: Not on file  . Stress: Not on file  Relationships  . Social Musicianconnections    Talks on phone: Not on file    Gets together: Not on file    Attends religious service: Not on file    Active member of club or organization: Not on file    Attends meetings of clubs or organizations: Not on file    Relationship status: Not on file  Other Topics Concern  . Not on file  Social History Narrative  . Not on file   Outpatient Encounter Medications as of 08/19/2018  Medication Sig  . levothyroxine (SYNTHROID) 88 MCG tablet Take 1  tablet (88 mcg total) by mouth daily before breakfast.  . [DISCONTINUED] levothyroxine (SYNTHROID, LEVOTHROID) 88 MCG tablet Take 1 tablet (88 mcg total) by mouth daily before breakfast.   No facility-administered encounter medications on file as of 08/19/2018.    ALLERGIES: Allergies  Allergen Reactions  . Penicillins     VACCINATION STATUS:  There is no immunization history on file for this patient.  HPI  23 year old female patient with medical history as above. She is being engaged in telehealth for follow-up of Hashimoto's thyroiditis induced hypothyroidism.     -She is currently on levothyroxine 88 mcg p.o. nightly.  She reports compliance to this medication.   -She complains that she continues to gain weight.  She does not follow any particular calorie restrictions methods.    -Her previsit thyroid ultrasound confirms heterogeneous ultrasound suggestive of Hashimoto's thyroiditis, no discrete nodules.    -She has family history of hypothyroidism in her mother who is taking levothyroxine. -She denies cold, heat intolerance. She denies dysphagia, shortness of breath, nor voice change.  Review of Systems  Limited as above.  Objective:  There were no vitals taken for this visit.  Wt Readings from Last 3 Encounters:  02/18/18 209 lb (94.8 kg)  06/19/17 207 lb (93.9 kg)  12/18/16 213 lb (96.6 kg)       Recent Results (from the past 2160 hour(s))  TSH     Status: None   Collection Time: 08/14/18 12:45 PM  Result Value Ref Range   TSH 1.88 mIU/L    Comment:           Reference Range .           > or = 20 Years  0.40-4.50 .                Pregnancy Ranges           First trimester    0.26-2.66           Second trimester   0.55-2.73           Third trimester    0.43-2.91   T4, free     Status: None   Collection Time: 08/14/18 12:45 PM  Result Value Ref Range   Free T4 1.5 0.8 - 1.8 ng/dL     Assessment & Plan:   1. Hypothyroidism 2. Hashimoto's  thyroiditis  She has hypothyroidism due to Hashimoto's thyroiditis.  -Her previsit thyroid function tests are consistent with appropriate replacement.  She is advised to continue levothyroxine 88 mcg p.o. daily before breakfast.     - We discussed about the correct intake of her thyroid hormone, on empty stomach at fasting, with water, separated by at least 30 minutes from breakfast and other medications,  and separated by more than 4 hours from calcium, iron, multivitamins, acid reflux medications (PPIs). -Patient is made aware of the fact that thyroid hormone replacement is needed for life, dose to be adjusted by periodic monitoring of thyroid function tests.   - She is advised to contact us if she plans or confirms pregnancy since she will need higher dose of levothyroxine during early pregnancy.  -Her baseline thyroid ultrasound is negative for nodular lesions, positive for heterogeneous consistent with Hashimoto's thyroiditis.  Regarding her concern for weight, it is likely due to excessive caloric intake. - she  admits there is a room for improvement in her diet and drink choices. -  Suggestion is made for her to avoid simple carbohydrates  from her diet including Cakes, Sweet Desserts / Pastries, Ice Cream, Soda (diet and regular), Sweet Tea, Candies, Chips, Cookies, Sweet Pastries,  Store Bought Juices, Alcohol in Excess of  1-2 drinks a day, Artificial Sweeteners, Coffee Creamer, and "Sugar-free" Products. This will help patient to have stable blood glucose profile and potentially avoid unintended weight gain.   - I advised patient to maintain close follow up with Cathie Olden, MD for primary care needs.  Time for this visit: 15 minutes. Lilliauna Van  participated in the discussions, expressed understanding, and voiced agreement with the above plans.  All questions were answered to her satisfaction. she is encouraged to contact clinic should she have any questions or  concerns prior to her return visit.  Follow up plan: Return in about 6 months (around 02/19/2019) for Follow up with Pre-visit Labs.  Glade Lloyd, MD Phone: [phone]  Fax: [phone]  -  This note was partially dictated with voice recognition software. Similar sounding words can be transcribed inadequately or may not  be corrected upon review.  08/19/2018, 12:05 PM

## 2019-02-22 ENCOUNTER — Telehealth: Payer: Self-pay | Admitting: "Endocrinology

## 2019-02-22 ENCOUNTER — Other Ambulatory Visit: Payer: Self-pay

## 2019-02-22 DIAGNOSIS — E038 Other specified hypothyroidism: Secondary | ICD-10-CM

## 2019-02-22 NOTE — Progress Notes (Signed)
Lab orders updated and sent. 

## 2019-02-22 NOTE — Telephone Encounter (Signed)
Could you update her lab order?

## 2019-02-22 NOTE — Telephone Encounter (Signed)
Lab orders updated and sent. 

## 2019-02-23 ENCOUNTER — Ambulatory Visit: Payer: BC Managed Care – PPO | Admitting: "Endocrinology

## 2019-03-03 LAB — T4, FREE: Free T4: 1.8 ng/dL (ref 0.8–1.8)

## 2019-03-03 LAB — TSH: TSH: 0.04 mIU/L — ABNORMAL LOW

## 2019-03-05 ENCOUNTER — Ambulatory Visit (INDEPENDENT_AMBULATORY_CARE_PROVIDER_SITE_OTHER): Payer: BC Managed Care – PPO | Admitting: "Endocrinology

## 2019-03-05 ENCOUNTER — Other Ambulatory Visit: Payer: Self-pay

## 2019-03-05 ENCOUNTER — Encounter: Payer: Self-pay | Admitting: "Endocrinology

## 2019-03-05 DIAGNOSIS — E063 Autoimmune thyroiditis: Secondary | ICD-10-CM | POA: Diagnosis not present

## 2019-03-05 DIAGNOSIS — E038 Other specified hypothyroidism: Secondary | ICD-10-CM

## 2019-03-05 NOTE — Progress Notes (Signed)
03/05/2019                                Endocrinology Telehealth Visit Follow up Note -During COVID -19 Pandemic  I connected with Emily Deleon on 03/05/2019   by telephone and verified that I am speaking with the correct person using two identifiers. Emily Deleon, August 19, 1995. she has verbally consented to this visit. All issues noted in this document were discussed and addressed. The format was not optimal for physical exam.  Subjective:    Patient ID: Emily Deleon, female    DOB: 03-13-1995, PCP Cathie Olden, MD   Past Medical History:  Diagnosis Date  . Hypothyroidism    Past Surgical History:  Procedure Laterality Date  . HEMORROIDECTOMY     Social History   Socioeconomic History  . Marital status: Single    Spouse name: Not on file  . Number of children: Not on file  . Years of education: Not on file  . Highest education level: Not on file  Occupational History  . Not on file  Tobacco Use  . Smoking status: Never Smoker  . Smokeless tobacco: Never Used  Substance and Sexual Activity  . Alcohol use: No  . Drug use: No  . Sexual activity: Not on file  Other Topics Concern  . Not on file  Social History Narrative  . Not on file   Social Determinants of Health   Financial Resource Strain:   . Difficulty of Paying Living Expenses: Not on file  Food Insecurity:   . Worried About Charity fundraiser in the Last Year: Not on file  . Ran Out of Food in the Last Year: Not on file  Transportation Needs:   . Lack of Transportation (Medical): Not on file  . Lack of Transportation (Non-Medical): Not on file  Physical Activity:   . Days of Exercise per Week: Not on file  . Minutes of Exercise per Session: Not on file  Stress:   . Feeling of Stress : Not on file  Social Connections:   . Frequency of Communication with Friends and Family: Not on file  . Frequency of Social Gatherings with Friends and Family: Not on file  . Attends Religious Services: Not on  file  . Active Member of Clubs or Organizations: Not on file  . Attends Archivist Meetings: Not on file  . Marital Status: Not on file   Outpatient Encounter Medications as of 03/05/2019  Medication Sig  . levothyroxine (SYNTHROID) 88 MCG tablet Take 1 tablet (88 mcg total) by mouth daily before breakfast.   No facility-administered encounter medications on file as of 03/05/2019.   ALLERGIES: Allergies  Allergen Reactions  . Penicillins     VACCINATION STATUS:  There is no immunization history on file for this patient.  HPI  24 year old female patient with medical history as above. She is being engaged in telehealth for follow-up of Hashimoto's thyroiditis induced hypothyroidism.     -She is currently on levothyroxine 88 mcg p.o. daily before breakfast.  She reports compliance with medication.  She has lost 4 pounds since last visit.   -She denies palpitations, tremors, heat intolerance.    -Her recent  thyroid ultrasound confirms heterogeneous ultrasound suggestive of Hashimoto's thyroiditis, no discrete nodules.    -She has family history of hypothyroidism in her mother who is taking levothyroxine. - She denies dysphagia, shortness of breath, nor voice change.  Review of Systems  Limited as above.  Objective:    There were no vitals taken for this visit.  Wt Readings from Last 3 Encounters:  02/18/18 209 lb (94.8 kg)  06/19/17 207 lb (93.9 kg)  12/18/16 213 lb (96.6 kg)       Recent Results (from the past 2160 hour(s))  T4, Free     Status: None   Collection Time: 03/02/19  2:49 PM  Result Value Ref Range   Free T4 1.8 0.8 - 1.8 ng/dL  TSH     Status: Abnormal   Collection Time: 03/02/19  2:49 PM  Result Value Ref Range   TSH 0.04 (L) mIU/L    Comment:           Reference Range .           > or = 20 Years  0.40-4.50 .                Pregnancy Ranges           First trimester    0.26-2.66           Second trimester   0.55-2.73            Third trimester    0.43-2.91      Assessment & Plan:   1. Hypothyroidism 2. Hashimoto's thyroiditis  She has hypothyroidism due to Hashimoto's thyroiditis.  -Her previsit thyroid function tests are consistent with slight over replacement.  However she would benefit from her current dose of levothyroxine until next measurement in 4 weeks.    She is advised to continue levothyroxine 88 mcg p.o. daily before breakfast.     - We discussed about the correct intake of her thyroid hormone, on empty stomach at fasting, with water, separated by at least 30 minutes from breakfast and other medications,  and separated by more than 4 hours from calcium, iron, multivitamins, acid reflux medications (PPIs). -Patient is made aware of the fact that thyroid hormone replacement is needed for life, dose to be adjusted by periodic monitoring of thyroid function tests.   - She is advised to contact us if she plans or confirms pregnancy since she will need higher dose of levothyroxine during early pregnancy. -She is also advised to contact us if she develops palpitations, tremors, or heat intolerance.  -Her baseline thyroid ultrasound is negative for nodular lesions, positive for heterogeneous consistent with Hashimoto's thyroiditis.  Regarding her concern for weight, it is likely due to excessive caloric intake.  -  Suggestion is made for her to avoid simple carbohydrates  from her diet including Cakes, Sweet Desserts / Pastries, Ice Cream, Soda (diet and regular), Sweet Tea, Candies, Chips, Cookies, Sweet Pastries,  Store Bought Juices, Alcohol in Excess of  1-2 drinks a day, Artificial Sweeteners, Coffee Creamer, and "Sugar-free" Products. This will help patient to have stable blood glucose profile and potentially avoid unintended weight gain.   - I advised patient to maintain close follow up with Valla Leaver, MD for primary care needs.     - Time spent on this patient care encounter:  20  minutes of which 50% was spent in  counseling and the rest reviewing  her current and  previous labs / studies and medications  doses and developing a plan for long term care. Artesha Wemhoff  participated in the discussions, expressed understanding, and voiced agreement with the above plans.  All questions were answered to her satisfaction. she is encouraged to contact clinic should  she have any questions or concerns prior to her return visit.  Follow up plan: Return in about 5 weeks (around 04/09/2019) for Follow up with Pre-visit Labs.  Marquis Lunch, MD Phone: [phone]  Fax: [phone]  -  This note was partially dictated with voice recognition software. Similar sounding words can be transcribed inadequately or may not  be corrected upon review.  03/05/2019, 9:15 AM

## 2019-04-03 LAB — T4, FREE: Free T4: 1.4 ng/dL (ref 0.8–1.8)

## 2019-04-03 LAB — TSH: TSH: 0.12 mIU/L — ABNORMAL LOW

## 2019-04-09 ENCOUNTER — Encounter: Payer: Self-pay | Admitting: "Endocrinology

## 2019-04-09 ENCOUNTER — Ambulatory Visit (INDEPENDENT_AMBULATORY_CARE_PROVIDER_SITE_OTHER): Payer: BC Managed Care – PPO | Admitting: "Endocrinology

## 2019-04-09 DIAGNOSIS — E063 Autoimmune thyroiditis: Secondary | ICD-10-CM | POA: Diagnosis not present

## 2019-04-09 DIAGNOSIS — E038 Other specified hypothyroidism: Secondary | ICD-10-CM

## 2019-04-09 MED ORDER — LEVOTHYROXINE SODIUM 75 MCG PO TABS: 75.0000 ug | ORAL_TABLET | Freq: Every day | ORAL | 3 refills | Status: AC

## 2019-04-09 NOTE — Progress Notes (Signed)
04/09/2019                                    Endocrinology Telehealth Visit Follow up Note -During COVID -19 Pandemic  I connected with Emily Deleon on 04/09/2019   by telephone and verified that I am speaking with the correct person using two identifiers. Emily Deleon, 1995-04-22. she has verbally consented to this visit. All issues noted in this document were discussed and addressed. The format was not optimal for physical exam.   Subjective:    Patient ID: Emily Deleon, female    DOB: [birth year], PCP Cathie Olden, MD   Past Medical History:  Diagnosis Date  . Hypothyroidism    Past Surgical History:  Procedure Laterality Date  . HEMORROIDECTOMY     Social History   Socioeconomic History  . Marital status: Single    Spouse name: Not on file  . Number of children: Not on file  . Years of education: Not on file  . Highest education level: Not on file  Occupational History  . Not on file  Tobacco Use  . Smoking status: Never Smoker  . Smokeless tobacco: Never Used  Substance and Sexual Activity  . Alcohol use: No  . Drug use: No  . Sexual activity: Not on file  Other Topics Concern  . Not on file  Social History Narrative  . Not on file   Social Determinants of Health   Financial Resource Strain:   . Difficulty of Paying Living Expenses:   Food Insecurity:   . Worried About Charity fundraiser in the Last Year:   . Arboriculturist in the Last Year:   Transportation Needs:   . Film/video editor (Medical):   Marland Kitchen Lack of Transportation (Non-Medical):   Physical Activity:   . Days of Exercise per Week:   . Minutes of Exercise per Session:   Stress:   . Feeling of Stress :   Social Connections:   . Frequency of Communication with Friends and Family:   . Frequency of Social Gatherings with Friends and Family:   . Attends Religious Services:   . Active Member of Clubs or Organizations:   . Attends Archivist Meetings:   Marland Kitchen Marital Status:     Outpatient Encounter Medications as of 04/09/2019  Medication Sig  . levothyroxine (SYNTHROID) 75 MCG tablet Take 1 tablet (75 mcg total) by mouth daily before breakfast.  . [DISCONTINUED] levothyroxine (SYNTHROID) 88 MCG tablet Take 1 tablet (88 mcg total) by mouth daily before breakfast.   No facility-administered encounter medications on file as of 04/09/2019.   ALLERGIES: Allergies  Allergen Reactions  . Penicillins     VACCINATION STATUS:  There is no immunization history on file for this patient.  HPI  24 year old female patient with medical history as above. She is being engaged in telehealth for hypothyroidism from Hashimoto's thyroiditis.   She is currently on levothyroxine 88 mcg p.o. daily before breakfast.  She reports compliance with medication.  She has lost 4 more pounds since last visit.   -She denies palpitations, tremors, heat intolerance.   She has no new complaints today.  -Her recent  thyroid ultrasound confirms heterogeneous ultrasound suggestive of Hashimoto's thyroiditis, no discrete nodules.    -She has family history of hypothyroidism in her mother who is taking levothyroxine. - She denies dysphagia, shortness of breath, nor voice change.  Review of Systems  Limited as above.  Objective:    There were no vitals taken for this visit.  Wt Readings from Last 3 Encounters:  02/18/18 209 lb (94.8 kg)  06/19/17 207 lb (93.9 kg)  12/18/16 213 lb (96.6 kg)       Recent Results (from the past 2160 hour(s))  T4, Free     Status: None   Collection Time: 03/02/19  2:49 PM  Result Value Ref Range   Free T4 1.8 0.8 - 1.8 ng/dL  TSH     Status: Abnormal   Collection Time: 03/02/19  2:49 PM  Result Value Ref Range   TSH 0.04 (L) mIU/L    Comment:           Reference Range .           > or = 20 Years  0.40-4.50 .                Pregnancy Ranges           First trimester    0.26-2.66           Second trimester   0.55-2.73           Third  trimester    0.43-2.91   TSH     Status: Abnormal   Collection Time: 04/02/19 12:32 PM  Result Value Ref Range   TSH 0.12 (L) mIU/L    Comment:           Reference Range .           > or = 20 Years  0.40-4.50 .                Pregnancy Ranges           First trimester    0.26-2.66           Second trimester   0.55-2.73           Third trimester    0.43-2.91   T4, free     Status: None   Collection Time: 04/02/19 12:32 PM  Result Value Ref Range   Free T4 1.4 0.8 - 1.8 ng/dL     Assessment & Plan:   1. Hypothyroidism 2. Hashimoto's thyroiditis  She has hypothyroidism due to Hashimoto's thyroiditis.  -Her previsit thyroid function tests are consistent with slight over replacement.  I discussed and lowered her levothyroxine to 75 mcg p.o. daily before breakfast.    - We discussed about the correct intake of her thyroid hormone, on empty stomach at fasting, with water, separated by at least 30 minutes from breakfast and other medications,  and separated by more than 4 hours from calcium, iron, multivitamins, acid reflux medications (PPIs). -Patient is made aware of the fact that thyroid hormone replacement is needed for life, dose to be adjusted by periodic monitoring of thyroid function tests.  - She is advised to contact us if she plans or confirms pregnancy since she will need higher dose of levothyroxine during early pregnancy. -She is also advised to contact us if she develops palpitations, tremors, or heat intolerance.  -Her baseline thyroid ultrasound is negative for nodular lesions, positive for heterogeneous consistent with Hashimoto's thyroiditis.     - I advised patient to maintain close follow up with Valla Leaver, MD for primary care needs.     - Time spent on this patient care encounter:  20 minutes of which 50% was spent in  counseling and the rest reviewing  her current and  previous labs / studies and medications  doses and developing a plan for  long term care. Emily Deleon  participated in the discussions, expressed understanding, and voiced agreement with the above plans.  All questions were answered to her satisfaction. she is encouraged to contact clinic should she have any questions or concerns prior to her return visit.   Follow up plan: Return in about 4 months (around 08/09/2019) for Follow up with Pre-visit Labs.  Marquis Lunch, MD Phone: [phone]  Fax: [phone]  -  This note was partially dictated with voice recognition software. Similar sounding words can be transcribed inadequately or may not  be corrected upon review.  04/09/2019, 1:11 PM

## 2019-07-27 LAB — TSH: TSH: 6.18 — AB (ref 0.41–5.90)

## 2019-08-11 ENCOUNTER — Ambulatory Visit: Payer: BC Managed Care – PPO | Admitting: "Endocrinology

## 2019-10-17 IMAGING — US US THYROID
1 series · 14 of 25 positions shown · non-contrast
Comparison: None.

CLINICAL DATA: Clinical goiter, Lisha, hypothyroidism

EXAM:
THYROID ULTRASOUND
TECHNIQUE: Ultrasound examination of the thyroid gland and adjacent soft
tissues was performed.

[Series 1: us thyroid · 0.06mm/px · 14 of 49 slices shown]
[im 1/49]
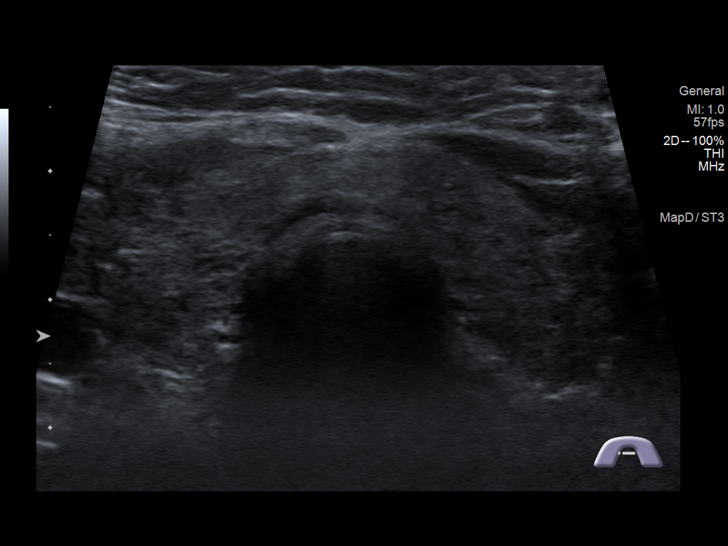
[im 5/49]
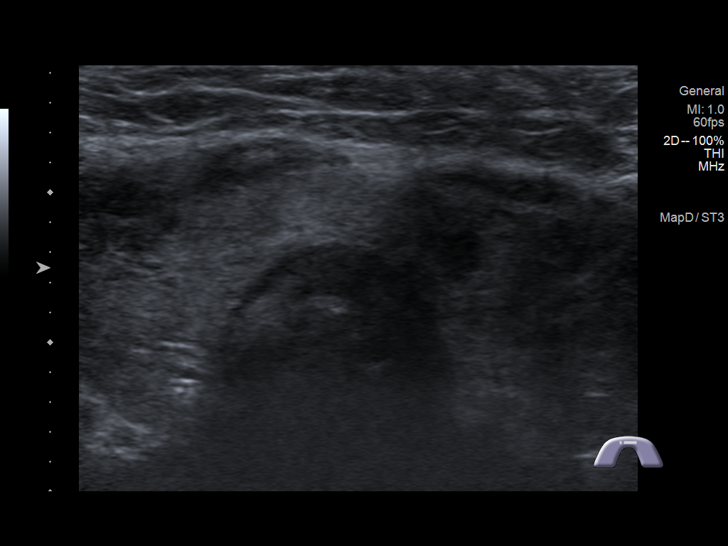
[im 9/49]
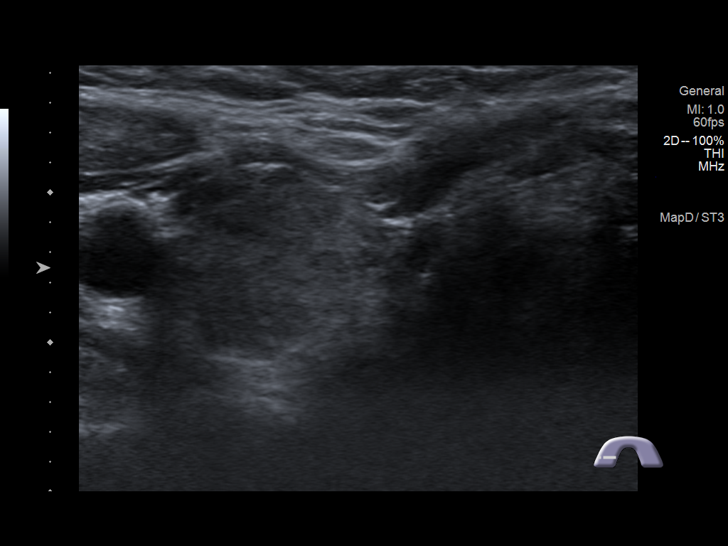
[im 13/49]
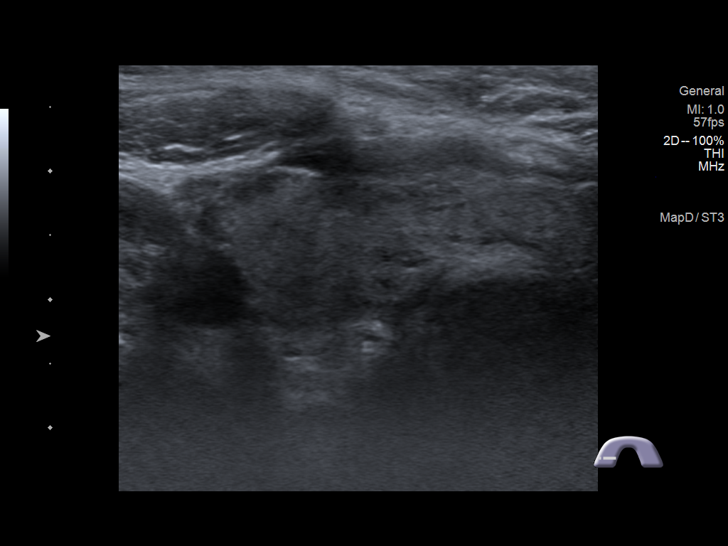
[im 17/49]
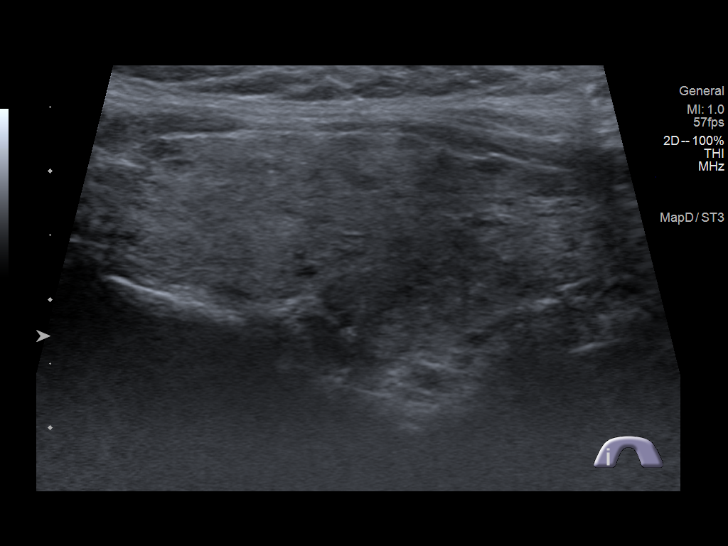
[im 19/49]
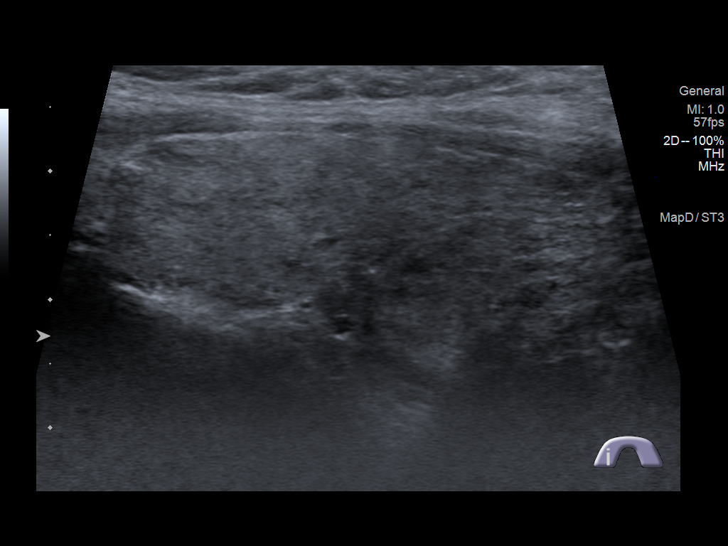
[im 23/49]
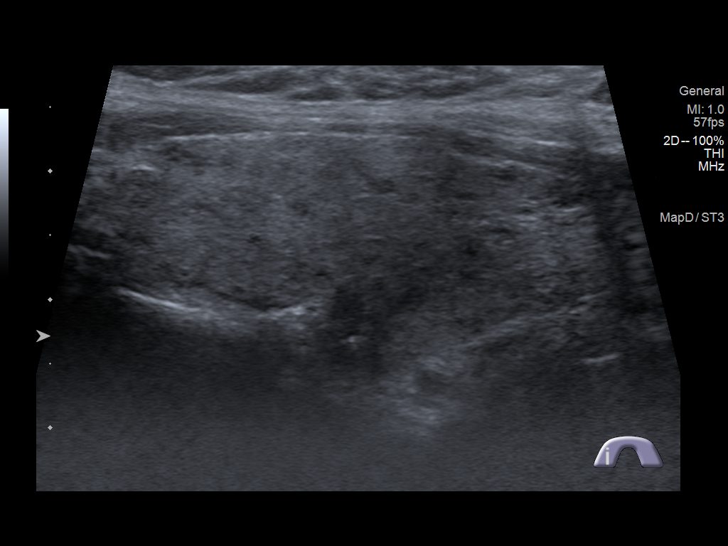
[im 27/49]
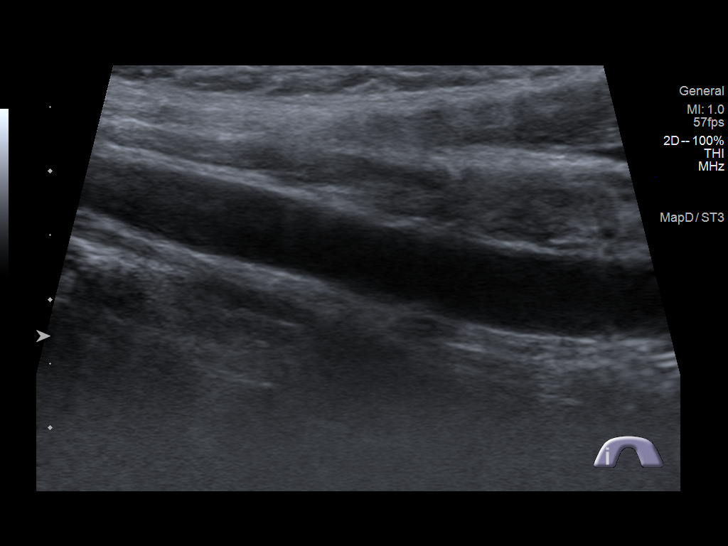
[im 31/49]
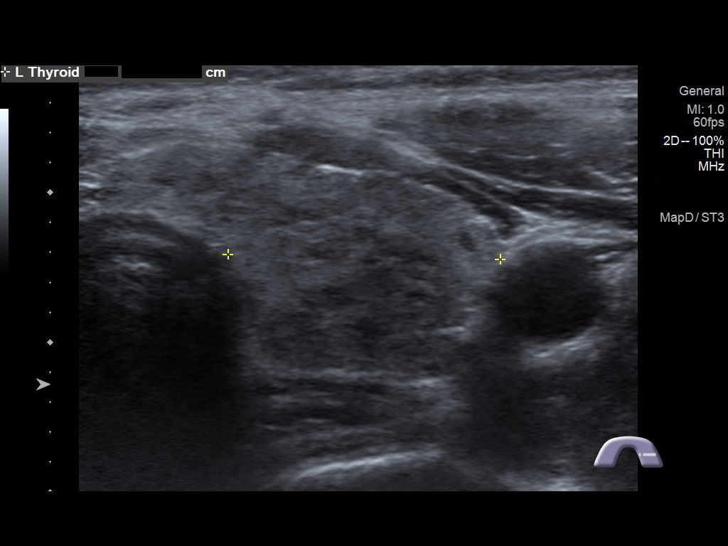
[im 33/49]
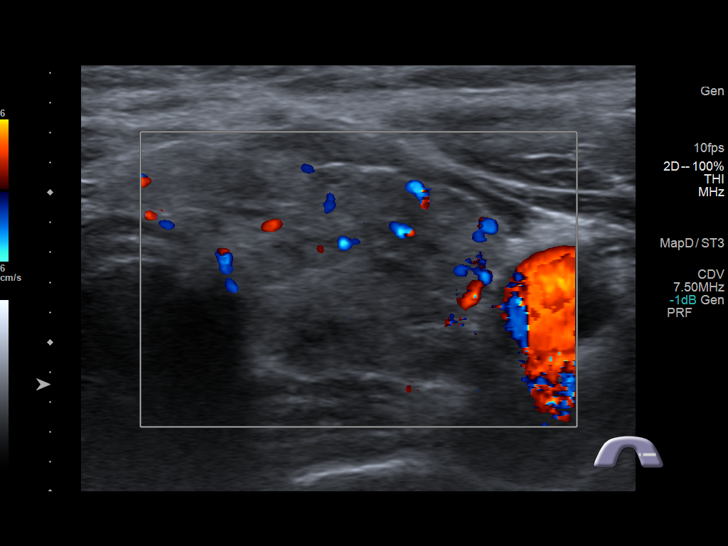
[im 37/49]
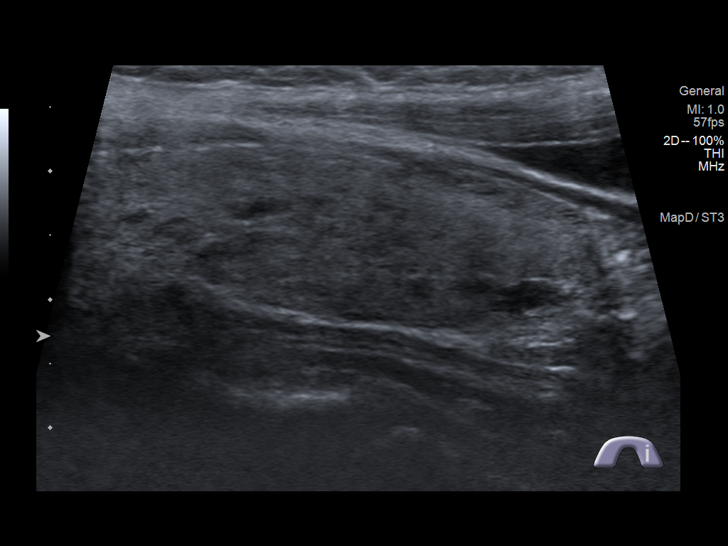
[im 41/49]
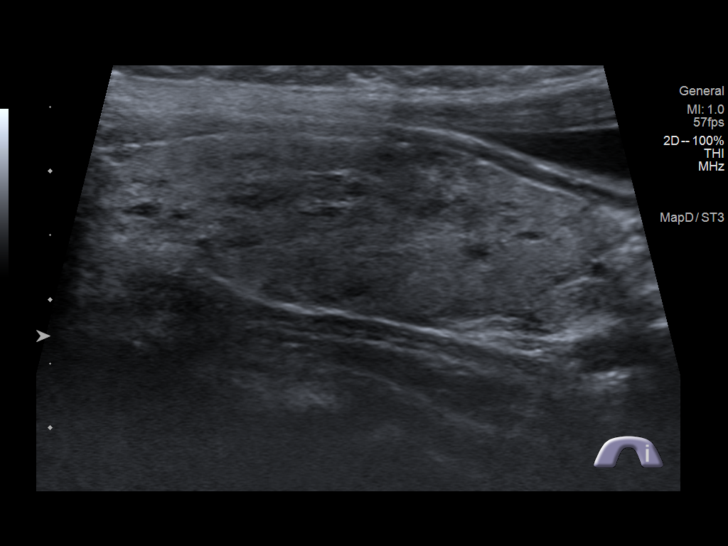
[im 45/49]
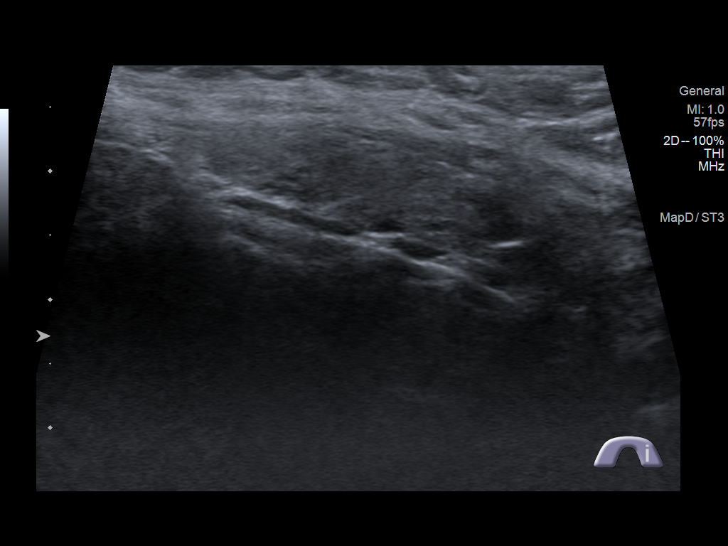
[im 49/49]
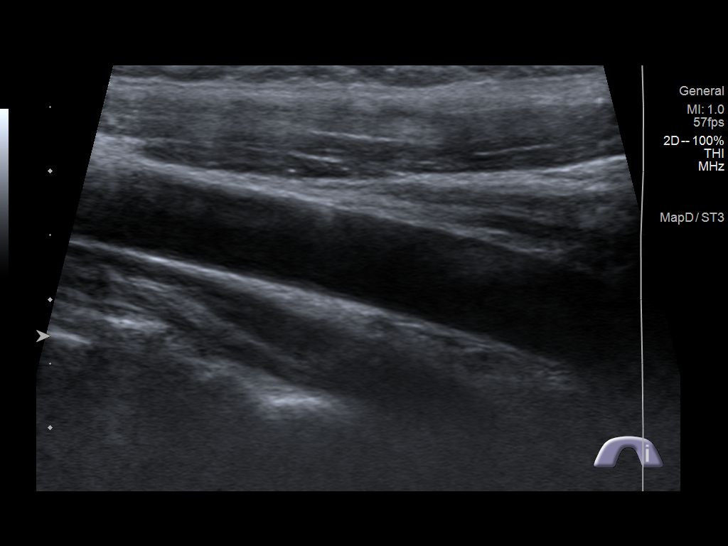

[14 of 25 positions shown; findings below may reference images not displayed]

FINDINGS: Parenchymal Echotexture: Moderately heterogenous

Isthmus: 5 mm

Right lobe: 4.8 x 1.7 x 1.7 cm

Left lobe: 4.9 x 1.3 x 0.8 cm

_________________________________________________________

Estimated total number of nodules >/= 1 cm: 0

Number of spongiform nodules >/=  2 cm not described below (TR1): 0

Number of mixed cystic and solid nodules >/= 1.5 cm not described
below (TR2): 0

_________________________________________________________

Moderate diffuse gland heterogeneity with a background pseudo micro
nodularity. No associated hypervascularity. No significant discrete
nodule or focal abnormality. Appearance suggest sequelae from prior
thyroiditis. No adenopathy.
IMPRESSION: Moderate gland heterogeneity as above. Suspect sequelae from prior
thyroiditis.

No significant focal abnormality or nodule.

The above is in keeping with the ACR TI-RADS recommendations - [HOSPITAL] 7135;[DATE].
